# Patient Record
Sex: Female | Born: 1980 | Race: White | Hispanic: No | Marital: Married | Smoking: Never smoker
Health system: Southern US, Community
[De-identification: ages and names within clinical notes are randomized; demographics above are authoritative.]

## PROBLEM LIST (undated history)

## (undated) DIAGNOSIS — F419 Anxiety disorder, unspecified: Secondary | ICD-10-CM

## (undated) DIAGNOSIS — E282 Polycystic ovarian syndrome: Secondary | ICD-10-CM

## (undated) HISTORY — DX: Anxiety disorder, unspecified: F41.9

## (undated) HISTORY — PX: BARIATRIC SURGERY: SHX1103

---

## 2011-12-30 ENCOUNTER — Ambulatory Visit (INDEPENDENT_AMBULATORY_CARE_PROVIDER_SITE_OTHER): Payer: BC Managed Care – PPO | Admitting: Internal Medicine

## 2011-12-30 VITALS — BP 110/75 | HR 82 | Temp 98.0°F | Resp 16 | Ht 66.0 in | Wt 276.6 lb

## 2011-12-30 DIAGNOSIS — N970 Female infertility associated with anovulation: Secondary | ICD-10-CM | POA: Insufficient documentation

## 2011-12-30 DIAGNOSIS — N390 Urinary tract infection, site not specified: Secondary | ICD-10-CM

## 2011-12-30 DIAGNOSIS — N949 Unspecified condition associated with female genital organs and menstrual cycle: Secondary | ICD-10-CM

## 2011-12-30 DIAGNOSIS — R102 Pelvic and perineal pain: Secondary | ICD-10-CM

## 2011-12-30 DIAGNOSIS — Z6841 Body Mass Index (BMI) 40.0 and over, adult: Secondary | ICD-10-CM | POA: Insufficient documentation

## 2011-12-30 LAB — POCT URINALYSIS DIPSTICK
Glucose, UA: NEGATIVE
Leukocytes, UA: NEGATIVE
Nitrite, UA: NEGATIVE
Urobilinogen, UA: 0.2

## 2011-12-30 LAB — POCT UA - MICROSCOPIC ONLY
Casts, Ur, LPF, POC: NEGATIVE
Yeast, UA: NEGATIVE

## 2011-12-30 MED ORDER — CIPROFLOXACIN HCL 500 MG PO TABS: 500.0000 mg | ORAL_TABLET | Freq: Two times a day (BID) | ORAL | Status: AC

## 2011-12-30 MED ORDER — TRAMADOL HCL 50 MG PO TABS: ORAL_TABLET | ORAL | Status: AC

## 2011-12-30 NOTE — Progress Notes (Signed)
  Subjective:    Patient ID: Abigail Young, female    DOB: 1980-09-25, 31 y.o.   MRN: [id number]  HPIClient complaining of suprapubic pain for greater than one month Was at [facility] at the onset of this with the diagnosis of norovirus/UTI was also suspected and she was treated with 3 weeks of Keflex/unsure whether culture was done She had severe GI side effects of Keflex and this exacerbated her usual diarrhea with irritable bowel syndrome Over the past week she has noticed urgency with increased frequency and small void but no dysuria Her pain is in the suprapubic area and is fairly constant and worse with activity/is different from her usual menstrual cramps She started her period today Married with one partner/has noted dyspareunia over the past month/no history of ovarian cysts/last ultrasound in January   Family history-her sister's Theressa Millard  Review of SystemsCurrent evaluation with gynecology  And endocrine infertility in an attempt to get pregnant/first cycle of Clomid in January/second to start next week No prior UTI or stone Denies nausea vomiting or constipation/diarrhea is frequent with IBS     Objective:   Physical ExamStable in no acute distress with a BMI of 44 Abdomen is soft/no organomegaly/tender in the suprapubic area without clear mass/ Introitus is clear without lesions There is no tenderness to percussion and no rebound There is no flank tenderness to percussion  Procedure: The cause of the sense of urgency and incomplete void/hand because she gave such a small urinary sample/and out catheterization was performed in attempt to obtain a clean-catch urine The original dirty urine had too numerous to count red blood cells/Urine volume was only 30 cc    Results for orders placed in visit on 12/30/11  POCT UA - MICROSCOPIC ONLY      Component Value Range   WBC, Ur, HPF, POC 0-2     RBC, urine, microscopic tntc     Bacteria, U  Microscopic trace     Mucus, UA 3+     Epithelial cells, urine per micros 3-6     Crystals, Ur, HPF, POC 1+ Ca Oxal     Casts, Ur, LPF, POC neg     Yeast, UA neg    POCT URINALYSIS DIPSTICK      Component Value Range   Color, UA yellow     Clarity, UA cloudy     Glucose, UA neg     Bilirubin, UA small     Ketones, UA trace     Spec Grav, UA >=1.030     Blood, UA large     pH, UA 5.0     Protein, UA 100     Urobilinogen, UA 0.2     Nitrite, UA neg     Leukocytes, UA Negative      Urinalysis on specimen obtained by catheterization was negative except specific gravity 10:30 The specimen was sent for culture Assessment & Plan:  Problem #1 pelvic pain Problem #2 urinary urgency frequency and small void Problem #3 BMI 44 Problem #4 irritable bowel syndrome  Urine culture Start Cipro 500 twice a day #20 Tramadol 5200 mg if needed for pain Set up transvaginal ultrasound if urine culture is negative-This may be best done with her gynecologist in w-s= Dr. O'Neill/ursa oneal?

## 2011-12-30 NOTE — Progress Notes (Signed)
  Subjective:    Patient ID: Abigail Young, female    DOB: [birth year], 31 y.o.   MRN: [id number]  HPI    Review of Systems     Objective:   Physical Exam I&O cath performed per protocol. ~20cc urine obtained.  Pt tolerated well      Performed by [facility] Assessment & Plan:

## 2012-01-01 LAB — URINE CULTURE: Organism ID, Bacteria: NO GROWTH

## 2012-05-27 ENCOUNTER — Encounter: Payer: BC Managed Care – PPO | Admitting: Obstetrics and Gynecology

## 2015-09-21 ENCOUNTER — Ambulatory Visit (INDEPENDENT_AMBULATORY_CARE_PROVIDER_SITE_OTHER): Payer: Managed Care, Other (non HMO) | Admitting: Physician Assistant

## 2015-09-21 VITALS — BP 110/84 | HR 78 | Temp 97.6°F | Resp 16 | Wt 295.6 lb

## 2015-09-21 DIAGNOSIS — Z23 Encounter for immunization: Secondary | ICD-10-CM | POA: Diagnosis not present

## 2015-09-21 DIAGNOSIS — Z3202 Encounter for pregnancy test, result negative: Secondary | ICD-10-CM

## 2015-09-21 LAB — POCT URINE PREGNANCY: PREG TEST UR: NEGATIVE

## 2015-09-21 NOTE — Patient Instructions (Addendum)
Return as needed.  MMR Vaccine (Measles, Mumps and Rubella): What You Need to Know 1. Why get vaccinated? Measles, mumps, and rubella are serious diseases. Before vaccines they were very common, especially among children. Measles  Measles virus causes rash, cough, runny nose, eye irritation, and fever.  It can lead to ear infection, pneumonia, seizures (jerking and staring), brain damage, and death. Mumps  Mumps virus causes fever, headache, muscle pain, loss of appetite, and swollen glands.  It can lead to deafness, meningitis (infection of the brain and spinal cord covering), painful swelling of the testicles or ovaries, and rarely sterility. Rubella ([place] Measles)  Rubella virus causes rash, arthritis (mostly in women), and mild fever.  If a woman gets rubella while she is pregnant, she could have a miscarriage or her baby could be born with serious birth defects. These diseases spread from person to person through the air. You can easily catch them by being around someone who is already infected. Measles, mumps, and rubella (MMR) vaccine can protect children (and adults) from all three of these diseases. Thanks to successful vaccination programs these diseases are much less common in the [place] than they used to be. But if we stopped vaccinating they would return.  2. Who should get MMR vaccine and when? Children should get 2 doses of MMR vaccine:  First Dose: 34-64 months of age  Second Dose: 57-57 years of age (may be given earlier, if at least 28 days after the 1st dose) Some infants younger than 12 months should get a dose of MMR if they are traveling out of the country. (This dose will not count toward their routine series.) Some adults should also get MMR vaccine: Generally, anyone 71 years of age or older who was born after 40 should get at least one dose of MMR vaccine, unless they can show that they have either been vaccinated or had all three diseases. MMR vaccine may be  given at the same time as other vaccines. Children between 68 and 61 years of age can get a "combination" vaccine called MMRV, which contains both MMR and varicella (chickenpox) vaccines. There is a separate Vaccine Information Statement for MMRV. 3. Some people should not get MMR vaccine or should wait.  Anyone who has ever had a life-threatening allergic reaction to the antibiotic neomycin, or any other component of MMR vaccine, should not get the vaccine. Tell your doctor if you have any severe allergies.  Anyone who had a life-threatening allergic reaction to a previous dose of MMR or MMRV vaccine should not get another dose.  Some people who are sick at the time the shot is scheduled may be advised to wait until they recover before getting MMR vaccine.  Pregnant women should not get MMR vaccine. Pregnant women who need the vaccine should wait until after giving birth. Women should avoid getting pregnant for 4 weeks after vaccination with MMR vaccine.  Tell your doctor if the person getting the vaccine:  Has HIV/AIDS, or another disease that affects the immune system  Is being treated with drugs that affect the immune system, such as steroids  Has any kind of cancer  Is being treated for cancer with radiation or drugs  Has ever had a low platelet count (a blood disorder)  Has gotten another vaccine within the past 4 weeks  Has recently had a transfusion or received other blood products Any of these might be a reason to not get the vaccine, or delay vaccination until later. 4.  What are the risks from MMR vaccine? A vaccine, like any medicine, is capable of causing serious problems, such as severe allergic reactions.  The risk of MMR vaccine causing serious harm, or death, is extremely small. Getting MMR vaccine is much safer than getting measles, mumps or rubella. Most people who get MMR vaccine do not have any serious problems with it. Mild problems  Fever (up to 1 person out of  6)  Mild rash (about 1 person out of 20)  Swelling of glands in the cheeks or neck (about 1 person out of 75) If these problems occur, it is usually within 6-14 days after the shot. They occur less often after the second dose. Moderate problems  Seizure (jerking or staring) caused by fever (about 1 out of 3,000 doses)  Temporary pain and stiffness in the joints, mostly in teenage or adult women (up to 1 out of 4)  Temporary low platelet count, which can cause a bleeding disorder (about 1 out of 30,000 doses) Severe problems (very rare)  Serious allergic reaction (less than 1 out of a million doses)  Several other severe problems have been reported after a child gets MMR vaccine, including:  Deafness  Long-term seizures, coma, or lowered consciousness  Permanent brain damage These are so rare that it is hard to tell whether they are caused by the vaccine.  5. What if there is a serious reaction? What should I look for?  Look for anything that concerns you, such as signs of a severe allergic reaction, very high fever, or behavior changes. Signs of a severe allergic reaction can include hives, swelling of the face and throat, difficulty breathing, a fast heartbeat, dizziness, and weakness. These would start a few minutes to a few hours after the vaccination.  What should I do?  If you think it is a severe allergic reaction or other emergency that can't wait, call 9-1-1 or get the person to the nearest hospital. Otherwise, call your doctor.  Afterward, the reaction should be reported to the Vaccine Adverse Event Reporting System (VAERS). Your doctor might file this report, or you can do it yourself through the VAERS web site at www.vaers.SamedayNews.es, or by calling 228-016-7317. VAERS is only for reporting reactions. They do not give medical advice. 6. The National Vaccine Injury Compensation Program The [employer] Vaccine Injury Compensation Program (VICP) is a federal program that was  created to compensate people who may have been injured by certain vaccines. Persons who believe they may have been injured by a vaccine can learn about the program and about filing a claim by calling 616-175-3581 or visiting the [place] website at GoldCloset.com.ee. 7. How can I learn more?  Ask your doctor.  Call your local or state health department.  Contact the Centers for Disease Control and Prevention (CDC):  Call 319-333-6996 (1-800-CDC-INFO)  or  Visit CDC's website at http://hunter.com/ CDC Measles, Mumps, and Rubella (MMR) Interim VIS (01/05/11)   This information is not intended to replace advice given to you by your health care provider. Make sure you discuss any questions you have with your health care provider.   Document Released: 07/01/2006 Document Revised: 09/24/2014 Document Reviewed: 10/15/2013 Elsevier Interactive Patient Education [employer].

## 2015-09-21 NOTE — Progress Notes (Signed)
[facility] [address], [place] [address] [phone]  Date:  09/21/2015   Name:  Abigail Young   DOB:  04/08/1981   MRN:  [id number]  PCP:  No primary care provider on file.    Chief Complaint: Immunizations   History of Present Illness:  This is a 35 y.o. female with PMH obesity who is presenting wanting a third MMR vaccine. Pt is a Insurance underwriter at [employer]. She states there was an outbreak of mumps on campus with 300+ cases. The school has recommended that all students get a booster even if they have already had 2 vaccines. She states pregnancy is very unlikely since she has PCOS but she cannot be 100% sure.  Review of Systems:  Review of Systems See HPI  Patient Active Problem List   Diagnosis Date Noted  . Body mass index (BMI) of 40.0-44.9 in adult ([place]) 12/30/2011  . Infertility associated with anovulation 12/30/2011    Prior to Admission medications   Medication Sig Start Date End Date Taking? Authorizing Provider  traMADol (ULTRAM) 50 MG tablet One to two q 6 h prn Patient not taking: Reported on 09/21/2015 12/30/11   Leandrew Koyanagi, MD    Allergies  Allergen Reactions  . Cephalexin Diarrhea and Nausea And Vomiting  . Glucophage [Metformin Hydrochloride] Diarrhea and Nausea Only    History reviewed. No pertinent past surgical history.  Social History  Substance Use Topics  . Smoking status: Never Smoker   . Smokeless tobacco: None  . Alcohol Use: None    History reviewed. No pertinent family history.  Medication list has been reviewed and updated.  Physical Examination:  Physical Exam  Constitutional: She is oriented to person, place, and time. She appears well-developed and well-nourished. No distress.  HENT:  Head: Normocephalic and atraumatic.  Right Ear: Hearing normal.  Left Ear: Hearing normal.  Nose: Nose normal.  Eyes: Conjunctivae and lids are normal. Right eye exhibits no discharge. Left eye exhibits no discharge.  No scleral icterus.  Pulmonary/Chest: Effort normal. No respiratory distress.  Musculoskeletal: Normal range of motion.  Neurological: She is alert and oriented to person, place, and time.  Skin: Skin is warm, dry and intact. No lesion and no rash noted.  Psychiatric: She has a normal mood and affect. Her speech is normal and behavior is normal. Thought content normal.   BP 110/84 mmHg  Pulse 78  Temp(Src) 97.6 F (36.4 C) (Oral)  Resp 16  Wt 295 lb 9.6 oz (134.083 kg)  SpO2 97%  Results for orders placed or performed in visit on 09/21/15  POCT urine pregnancy  Result Value Ref Range   Preg Test, Ur Negative Negative    Assessment and Plan:  1. Need for MMR vaccine - POCT urine pregnancy - MMR vaccine subcutaneous   Benjaman Pott. Drenda Freeze, MHS [facility]  09/21/2015

## 2017-09-12 ENCOUNTER — Encounter (HOSPITAL_COMMUNITY): Payer: Self-pay

## 2017-09-12 ENCOUNTER — Emergency Department (HOSPITAL_COMMUNITY)
Admission: EM | Admit: 2017-09-12 | Discharge: 2017-09-12 | Disposition: A | Discharge status: Home / Self Care | Payer: 59 | Attending: Emergency Medicine | Admitting: Emergency Medicine

## 2017-09-12 ENCOUNTER — Emergency Department (HOSPITAL_COMMUNITY): Payer: 59

## 2017-09-12 DIAGNOSIS — K8020 Calculus of gallbladder without cholecystitis without obstruction: Secondary | ICD-10-CM | POA: Insufficient documentation

## 2017-09-12 DIAGNOSIS — R109 Unspecified abdominal pain: Secondary | ICD-10-CM | POA: Diagnosis present

## 2017-09-12 HISTORY — DX: Polycystic ovarian syndrome: E28.2

## 2017-09-12 LAB — COMPREHENSIVE METABOLIC PANEL
ALT: 41 U/L (ref 14–54)
AST: 81 U/L — AB (ref 15–41)
Albumin: 4 g/dL (ref 3.5–5.0)
Alkaline Phosphatase: 141 U/L — ABNORMAL HIGH (ref 38–126)
Anion gap: 11 (ref 5–15)
BILIRUBIN TOTAL: 1.2 mg/dL (ref 0.3–1.2)
BUN: 14 mg/dL (ref 6–20)
CO2: 23 mmol/L (ref 22–32)
Calcium: 9 mg/dL (ref 8.9–10.3)
Chloride: 102 mmol/L (ref 101–111)
Creatinine, Ser: 0.79 mg/dL (ref 0.44–1.00)
GFR calc Af Amer: >60 mL/min (ref >60)
Glucose, Bld: 95 mg/dL (ref 65–99)
POTASSIUM: 3.8 mmol/L (ref 3.5–5.1)
Sodium: 136 mmol/L (ref 135–145)
TOTAL PROTEIN: 6.6 g/dL (ref 6.5–8.1)

## 2017-09-12 LAB — URINALYSIS, ROUTINE W REFLEX MICROSCOPIC
Bilirubin Urine: NEGATIVE
Glucose, UA: NEGATIVE mg/dL
Hgb urine dipstick: NEGATIVE
KETONES UR: 5 mg/dL — AB
LEUKOCYTES UA: NEGATIVE
NITRITE: NEGATIVE
PH: 5 (ref 5.0–8.0)
PROTEIN: NEGATIVE mg/dL
Specific Gravity, Urine: 1.018 (ref 1.005–1.030)

## 2017-09-12 LAB — I-STAT BETA HCG BLOOD, ED (MC, WL, AP ONLY): I-stat hCG, quantitative: <5 m[IU]/mL (ref <5)

## 2017-09-12 LAB — CBC
HEMATOCRIT: 41.3 % (ref 36.0–46.0)
Hemoglobin: 14 g/dL (ref 12.0–15.0)
MCH: 29.9 pg (ref 26.0–34.0)
MCHC: 33.9 g/dL (ref 30.0–36.0)
MCV: 88.2 fL (ref 78.0–100.0)
PLATELETS: 279 10*3/uL (ref 150–400)
RBC: 4.68 MIL/uL (ref 3.87–5.11)
RDW: 13 % (ref 11.5–15.5)
WBC: 12.5 10*3/uL — AB (ref 4.0–10.5)

## 2017-09-12 LAB — LIPASE, BLOOD: Lipase: 33 U/L (ref 11–51)

## 2017-09-12 LAB — TROPONIN I

## 2017-09-12 MED ORDER — ONDANSETRON HCL 4 MG PO TABS: 4.0000 mg | ORAL_TABLET | Freq: Three times a day (TID) | ORAL | 0 refills | Status: AC | PRN

## 2017-09-12 MED ORDER — IOPAMIDOL (ISOVUE-300) INJECTION 61%
INTRAVENOUS | Status: AC
  Administered 2017-09-12: 100 mL
  Filled 2017-09-12: qty 100

## 2017-09-12 MED ORDER — HYDROCODONE-ACETAMINOPHEN 5-325 MG PO TABS: 1.0000 | ORAL_TABLET | Freq: Four times a day (QID) | ORAL | 0 refills | Status: AC | PRN

## 2017-09-12 NOTE — ED Provider Notes (Signed)
[facility] EMERGENCY DEPARTMENT Provider Note   CSN: [id number] Arrival date & time: 09/12/17  1511     History   Chief Complaint Chief Complaint  Patient presents with  . Abdominal Pain    HPI Abigail Young is a 36 y.o. female.  HPI   36 year old obese female with history of anxiety and PCOS presenting for evaluation of abdominal pain.  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  She is here due to upper abdominal pain.  She report having acute onset of pain approximately 4 hours ago.  Pain is described as a sharp sensation to the upper abdomen radiates to her right shoulder.  Pain initially very intense, which is since subsided.  Worsening pain when she drinks some fluid earlier.  She felt nauseous, and did vomit several times of nonbloody nonbilious contents.  Since then her pain has improved.  She has a bout of loose stools earlier today.  She is able to pass flatus.  She denies fever, chills, chest pain, shortness of breath, back pain, dysuria, hematuria or rash.  She report one similar episode like this in the past and was diagnosed with gastroenteritis.  Past Medical History:  Diagnosis Date  . Anxiety   . PCOS (polycystic ovarian syndrome)     Patient Active Problem List   Diagnosis Date Noted  . Body mass index (BMI) of 40.0-44.9 in adult ([place]) 12/30/2011  . Infertility associated with anovulation 12/30/2011    Past Surgical History:  Procedure Laterality Date  . BARIATRIC SURGERY      OB History    No data available       Home Medications    Prior to Admission medications   Medication Sig Start Date End Date Taking? Authorizing Provider  traMADol Veatrice Bourbon) 50 MG tablet One to two q 6 h prn Patient not taking: Reported on 09/21/2015 12/30/11   Leandrew Koyanagi, MD    Family History History reviewed. No pertinent family history.  Social History Social History   Tobacco Use  . Smoking status: Never Smoker  .  Smokeless tobacco: Never Used  Substance Use Topics  . Alcohol use: Not on file  . Drug use: Not on file     Allergies   Cephalexin and Glucophage [metformin hydrochloride]   Review of Systems Review of Systems  All other systems reviewed and are negative.    Physical Exam Updated Vital Signs BP 111/63   Pulse 60   Temp 98.9 F (37.2 C)   Resp 18   Ht 5' 7"  (1.702 m)   Wt 66.7 kg (147 lb)   LMP 08/19/2017 (Exact Date)   SpO2 99%   BMI 23.02 kg/m   Physical Exam  Constitutional: She appears well-developed and well-nourished. No distress.  HENT:  Head: Atraumatic.  Eyes: Conjunctivae are normal.  Neck: Neck supple.  Cardiovascular: Normal rate and regular rhythm.  Pulmonary/Chest: Effort normal and breath sounds normal.  Abdominal: Soft. Normal appearance. She exhibits no distension and no mass. Bowel sounds are decreased. There is tenderness (Epigastric tenderness without guarding or rebound tenderness).  Neurological: She is alert.  Skin: No rash noted.  Psychiatric: She has a normal mood and affect.  Nursing note and vitals reviewed.    ED Treatments / Results  Labs (all labs ordered are listed, but only abnormal results are displayed) Labs Reviewed  COMPREHENSIVE METABOLIC PANEL - Abnormal; Notable for the following components:      Result Value  AST 81 (*)    Alkaline Phosphatase 141 (*)    All other components within normal limits  CBC - Abnormal; Notable for the following components:   WBC 12.5 (*)    All other components within normal limits  URINALYSIS, ROUTINE W REFLEX MICROSCOPIC - Abnormal; Notable for the following components:   Ketones, ur 5 (*)    All other components within normal limits  LIPASE, BLOOD  TROPONIN I  I-STAT BETA HCG BLOOD, ED ([facility], [facility], [facility] ONLY)    EKG  EKG Interpretation None     ED ECG REPORT   Date: 09/12/2017  Rate: 75  Rhythm: normal sinus rhythm  QRS Axis: normal  Intervals: normal  ST/T Wave  abnormalities: normal  Conduction Disutrbances:none  Narrative Interpretation: biatrial enlargement  Old EKG Reviewed: none available  I have personally reviewed the EKG tracing and agree with the computerized printout as noted.   Radiology Ct Abdomen Pelvis W Contrast  Result Date: 09/12/2017 CLINICAL DATA:  Abdominal pain EXAM: CT ABDOMEN AND PELVIS WITH CONTRAST TECHNIQUE: Multidetector CT imaging of the abdomen and pelvis was performed using the standard protocol following bolus administration of intravenous contrast. CONTRAST:  160m ISOVUE-300 IOPAMIDOL (ISOVUE-300) INJECTION 61% COMPARISON:  None. FINDINGS: Lower chest: Lung bases are clear. Hepatobiliary: No focal liver lesions are appreciable. Gallbladder wall does not appear appreciably thickened. There is apparent small amount of fluid in the gallbladder fossa region. No biliary duct dilatation evident. Pancreas: There is no appreciable pancreatic mass or inflammatory focus. Spleen: No splenic lesions are evident. Adrenals/Urinary Tract: Adrenals bilaterally appear normal. There is a 1 x 1 cm cyst arising from the posterior upper to mid left kidney. More superiorly, there is an 8 x 5 mm cyst. No hydronephrosis is evident in either kidney. There is no renal or ureteral calculus on either side. Urinary bladder is midline with wall thickness within normal limits. Stomach/Bowel: There is evidence of previous gastric bypass type procedure without wall thickening or fluid in this area. Elsewhere there is no bowel wall or mesenteric thickening. No evident bowel obstruction. No free air or portal venous air. Vascular/Lymphatic: There is no abdominal aortic aneurysm. No vascular lesions are evident. There is no evident adenopathy in the abdomen or pelvis. Reproductive: Uterus is anteverted. There is a collapsed cyst in the right ovary measuring 1.4 x 1.0 cm. There is moderate fluid tracking from the right adnexa into the cul-de-sac region consistent  with ovarian cyst rupture. No other pelvic mass is evident. Other: Appendix appears unremarkable. There is no abscess in the abdomen or pelvis. Musculoskeletal: No blastic or lytic bone lesions. No intramuscular or abdominal wall lesions. IMPRESSION: 1. Evidence of recent right ovarian cyst rupture with fluid tracking the cul de sac region. 2. The small amount of fluid is noted in the gallbladder fossa region. The adjacent gallbladder wall does not appear dilated. Question ascitic fluid from the pelvis tracking into this area versus possible fluid due to gallbladder inflammation. This finding may warrant ultrasound of the gallbladder to further assess in this regard. 3. 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 No bowel obstruction. No internal hernia demonstrated on this study. No abscess evident in the abdomen or pelvis. Appendix appears normal. 4.  No renal or ureteral calculus.  No hydronephrosis. Electronically Signed   By: WLowella GripIII M.D.   On: 09/12/2017 20:37    Procedures Procedures (including critical care time)  Medications Ordered in ED Medications  iopamidol (ISOVUE-300) 61 %  injection (100 mLs  Contrast Given 09/12/17 2012)     Initial Impression / Assessment and Plan / ED Course  I have reviewed the triage vital signs and the nursing notes.  Pertinent labs & imaging results that were available during my care of the patient were reviewed by me and considered in my medical decision making (see chart for details).     BP 111/63   Pulse 60   Temp 98.9 F (37.2 C)   Resp 18   Ht 5' 7"  (1.702 m)   Wt 66.7 kg (147 lb)   LMP 08/19/2017 (Exact Date)   SpO2 99%   BMI 23.02 kg/m    Final Clinical Impressions(s) / ED Diagnoses   Final diagnoses:  None    ED Discharge Orders    None     7:00 PM Patient with history of Roux-en-Y here with upper abdominal pain, nausea and vomiting.  Symptom has since subsided however having some  pain and she would evaluate for potential small bowel obstruction versus perforated bowel.  CT scan ordered.  Pain medication offered but patient declined.  She is stable at this time.  8:53 PM Labs remarkable for mildly elevated white count of 12.5.  Electrolytes panels are reassuring.  Mild elevations of AST of 81, alk phos of 141.  Pregnancy test negative, lipase normal, EKG and troponins unremarkable, renal function is fine, and urine shows no signs of urinary tract infection.  An abdominal pelvis CT scan performed showing evidence of a recent right ovarian cyst rupture with fluid tracking the cul-de-sac region.  Also a small amount of fluid is noted in the gallbladder fossa region.  This could be fluid that is tracking from the pelvis or could be possible gallbladder inflammation.  However the gallbladder wall does not appears to be dilated.  No document of gallstone.  Evidence of prior gastric bypass procedure without any complication feature.  No bowel obstruction and no hernia or abscess.  Appendix is normal, renal function is normal and kidney function is normal.  I discussed this finding with patient.  I also offer ultrasound of the gallbladder for further evaluation. Pt agrees with ultrasound.  Care sign out with oncoming provider who will f/u with [place] result.  Pt currently does not request pain medication or antinausea medication.    Domenic Moras, PA-C 09/13/17 1102    Duffy Bruce, MD 09/13/17 1520

## 2017-09-12 NOTE — ED Triage Notes (Signed)
Pt presents with onset of epigastric pain that began today.  Pt describes as a burning and aching that is now generalized.  Pt reports pain radiates into R shoulder, pt attempted to sip water but burning sensation increased.  -nausea, pt had diarrhea this morning, denies any dysuria.

## 2017-09-12 NOTE — ED Notes (Signed)
Patient transported to Ultrasound 

## 2017-09-12 NOTE — ED Notes (Signed)
Lab adding on a troponin

## 2017-09-12 NOTE — ED Provider Notes (Signed)
Patient signed out to me by Abigail BarnacleP Tran, PA-C.  Please see previous notes for further history.  In brief, patient with a history of Roux-en-Y.  She presents with acute epigastric pain today with associated nausea and vomiting.  She has right shoulder pain.  White count mildly elevated at 12.  CT showed recent rupture of right ovarian cyst with some pericholecystic fluid, recommended ultrasound for follow-up.  P.o. challenge and fluids did not improve symptoms, ultrasound pending.  US shows gallstones without signs of gallbladder wall thickening or cholecystitis.  Discussed findings with patient.  Patient states she is feeling much better.  Will treat symptomatically with Zofran and Norco as needed.  Patient to follow-up with surgery for further evaluation and management.  At this time, patient appears safe for discharge.  Return precautions given.  Patient states she understands and agrees to plan.    Abigail Young, Abigail Gannett, PA-C 09/13/17 0036    Jacalyn LefevreHaviland, Julie, MD 09/13/17 1511

## 2017-09-12 NOTE — Discharge Instructions (Signed)
Be careful with what you are eating.  Try to avoid fatty foods. Use Zofran as needed for nausea or vomiting. Take Tylenol as needed for mild to moderate pain.  Take Norco as needed for severe pain.  Do not take the 2 together, as this will be too much Tylenol. Follow-up with surgery for further evaluation of gallstones or if your symptoms are not improving. Return to the emergency room if your pain worsens, you have persistent vomiting despite medication, you develop fevers, or you have any new or concerning symptoms.

## 2017-09-19 MED FILL — HYDROCODON-APAP 5-325: 5-325 | 1 days supply | Qty: 6 | Fill #0

## 2017-09-19 MED FILL — ONDANSETRON HCL 4 MG TABLET: 4 | 4 days supply | Qty: 12 | Fill #0

## 2019-03-24 IMAGING — US US ABDOMEN LIMITED
1 series · 14 of 25 positions shown · non-contrast
Comparison: CT abdomen/pelvis dated 09/12/2017

CLINICAL DATA: Abdominal pain, abnormal gallbladder on CT

EXAM:
ULTRASOUND ABDOMEN LIMITED RIGHT UPPER QUADRANT

[Series 1: us abdomen limited · 0.22mm/px · 14 of 70 slices shown]
[im 1/70]
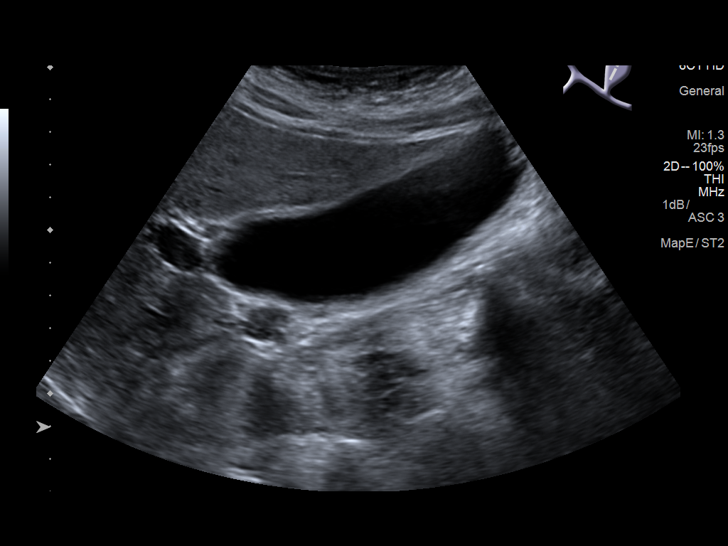
[im 6/70]
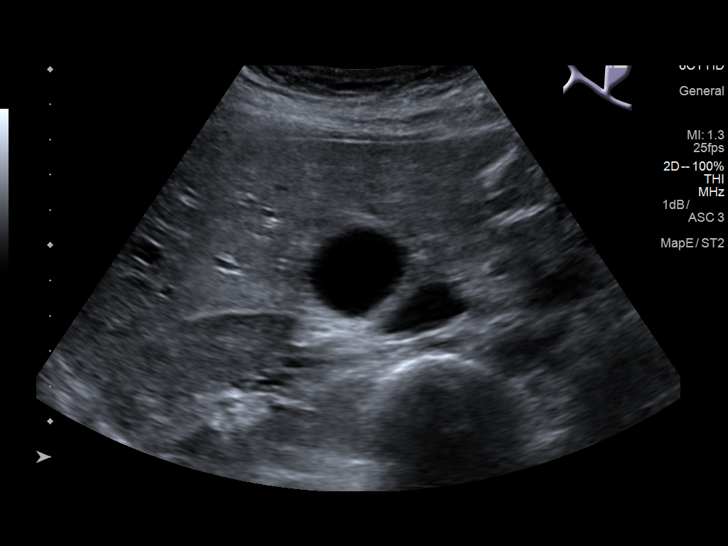
[im 12/70]
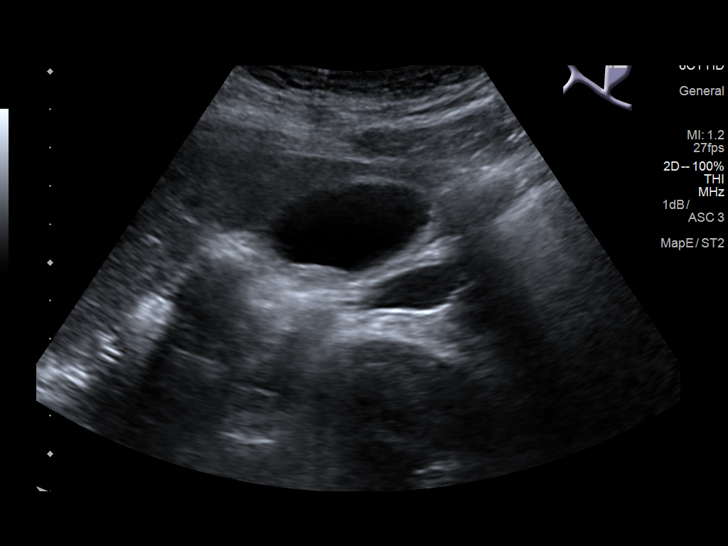
[im 18/70]
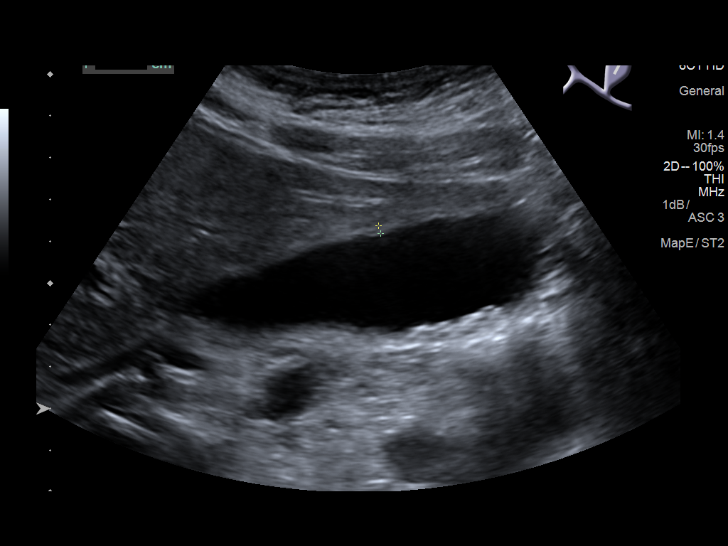
[im 24/70]
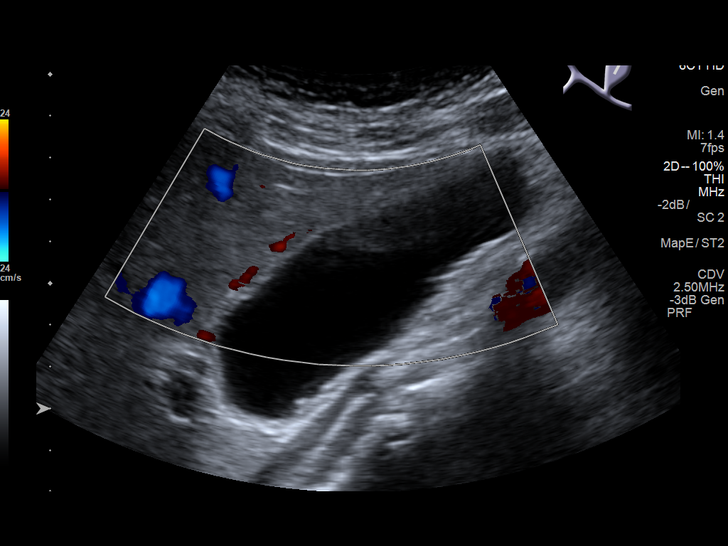
[im 26/70]
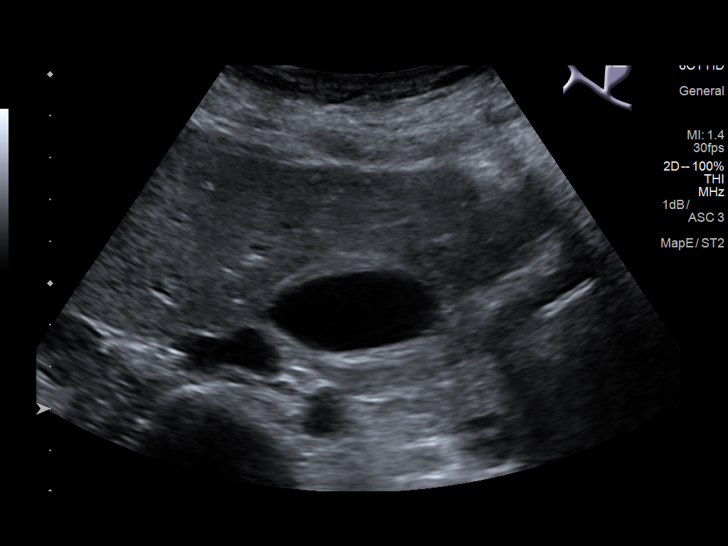
[im 32/70]
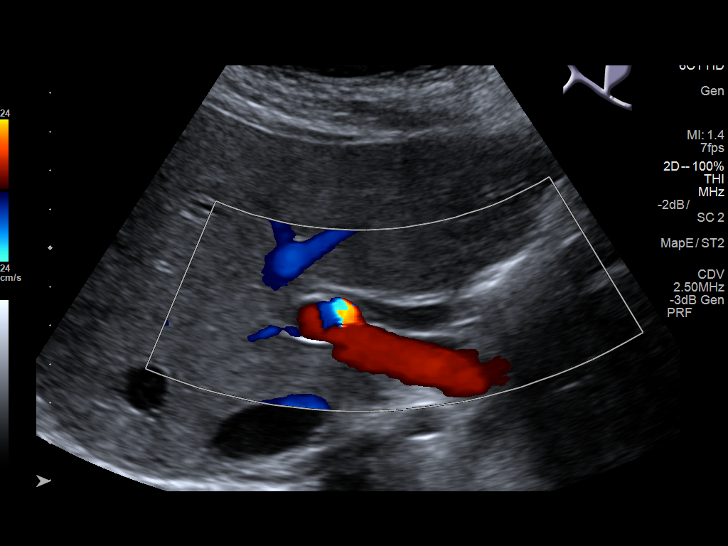
[im 38/70]
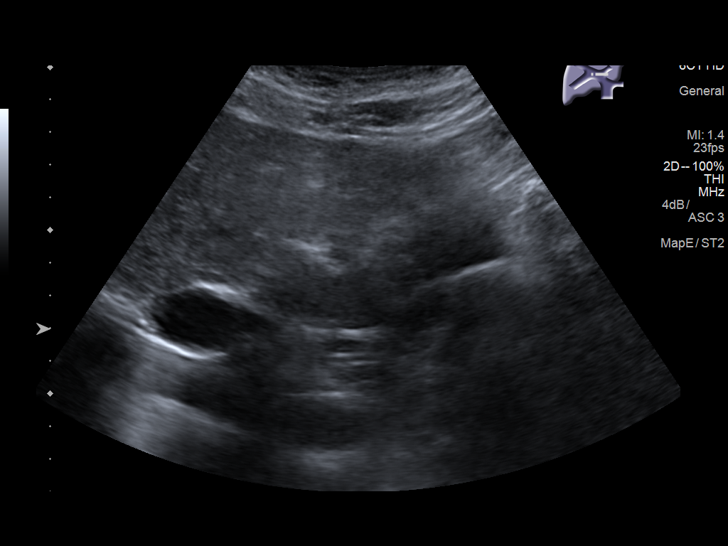
[im 44/70]
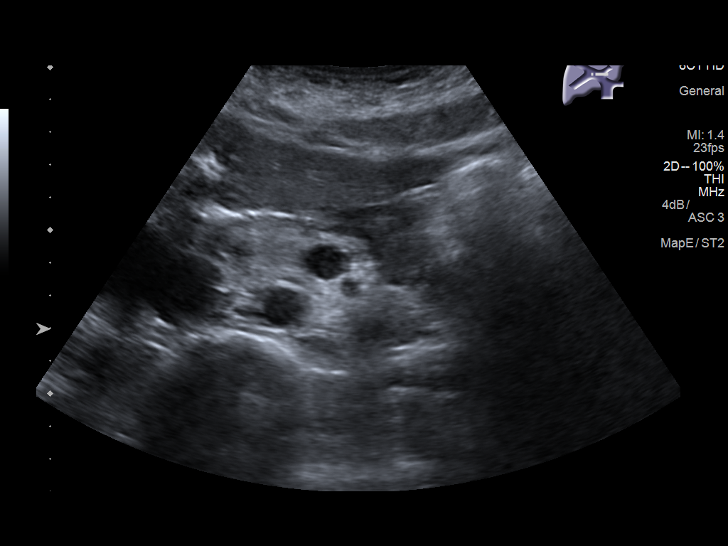
[im 47/70]
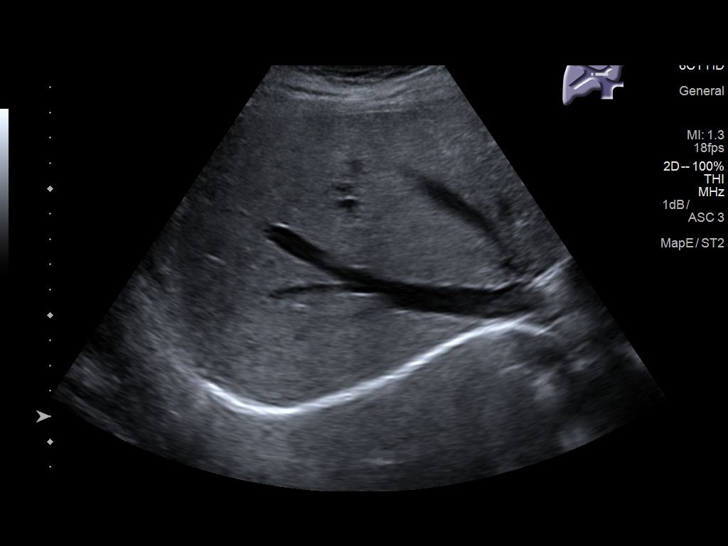
[im 52/70]
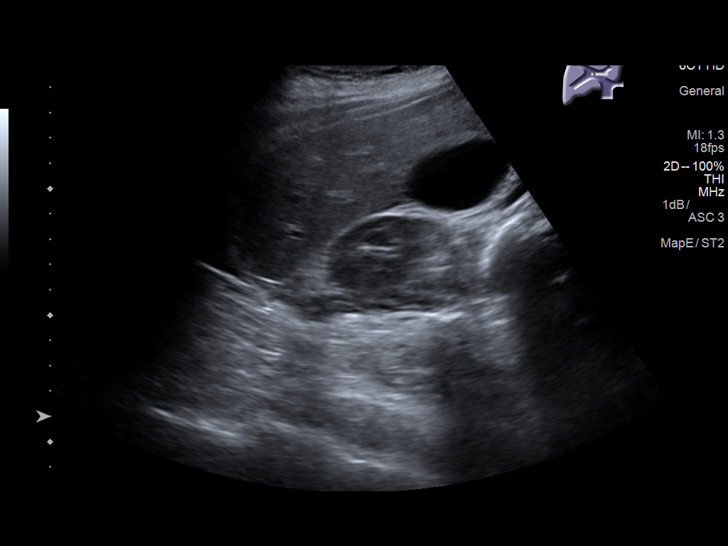
[im 58/70]
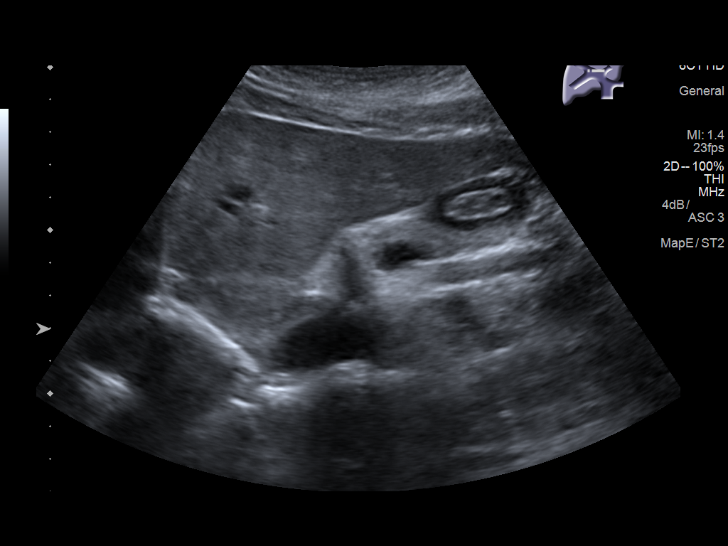
[im 64/70]
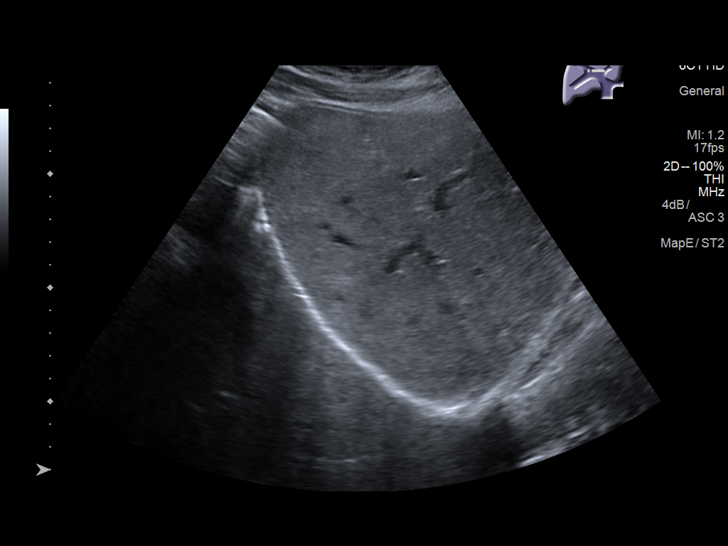
[im 70/70]
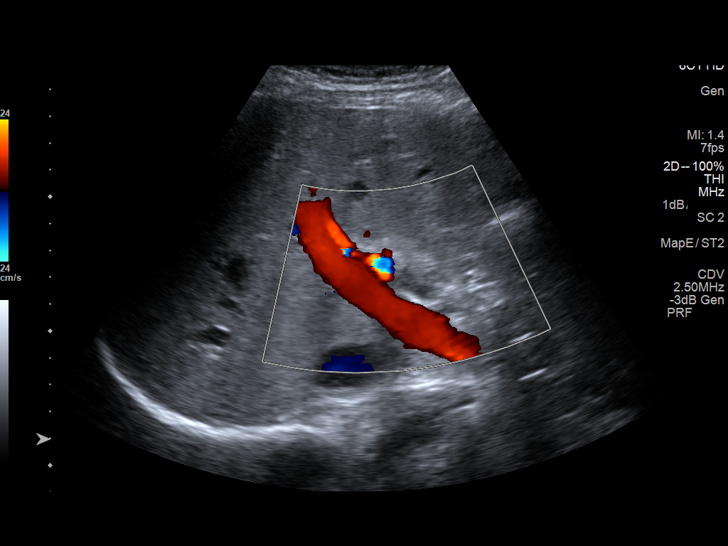

[14 of 25 positions shown; findings below may reference images not displayed]

FINDINGS: Gallbladder:

Layering gallstones measuring up to 15 mm. Trace pericholecystic
fluid without appreciable gallbladder wall thickening. Negative
sonographic Murphy's sign.

Common bile duct:

Diameter: 7 mm

Liver:

No focal lesion identified. Within normal limits in parenchymal
echogenicity. Portal vein is patent on color Doppler imaging with
normal direction of blood flow towards the liver.
IMPRESSION: Cholelithiasis, without convincing sonographic findings to suggest
acute cholecystitis.

## 2019-03-30 IMAGING — CT CT ABD-PELV W/ CM
2 of 4 series · 16 of 46 positions shown, 18 images · IV contrast (APPLIED)
Comparison: None.

CLINICAL DATA: Abdominal pain

EXAM:
CT ABDOMEN AND PELVIS WITH CONTRAST
TECHNIQUE: Multidetector CT imaging of the abdomen and pelvis was performed
using the standard protocol following bolus administration of
intravenous contrast.
CONTRAST:  100mL LA4JAK-MMM IOPAMIDOL (LA4JAK-MMM) INJECTION 61%

[Series 3: abd/ pelvis 5.0 i30f 2 · axial · 0.86mm/px · z∈[-572,-132]mm · 13 of 98 slices shown, 15 images]
[im 5/98  soft-tissue]
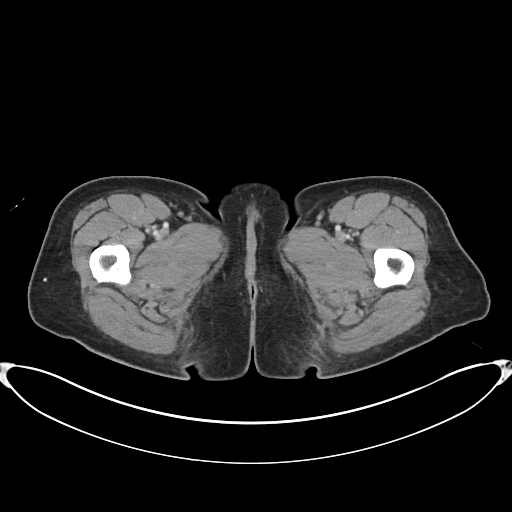
[im 5/98  bone]
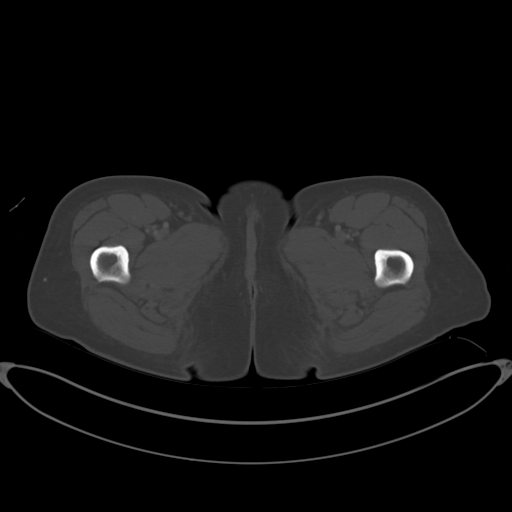
[im 13/98  soft-tissue]
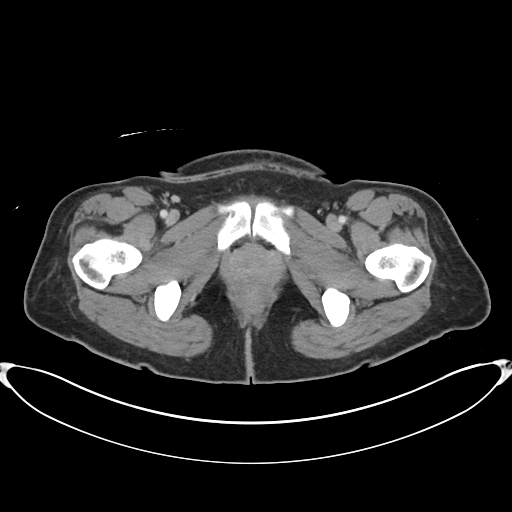
[im 21/98  soft-tissue]
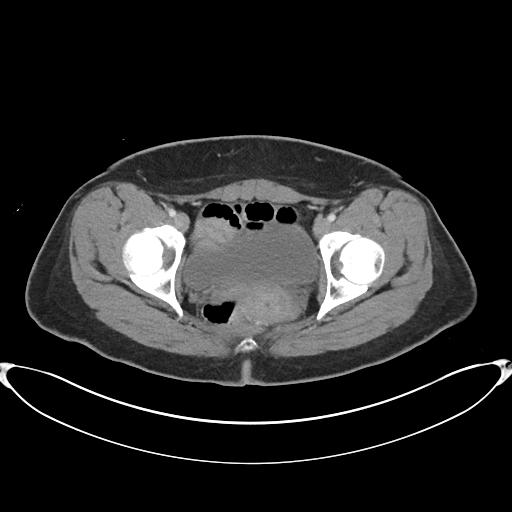
[im 29/98  soft-tissue]
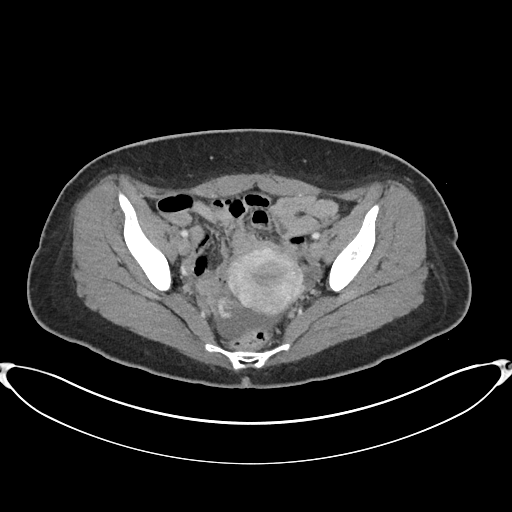
[im 33/98  soft-tissue]
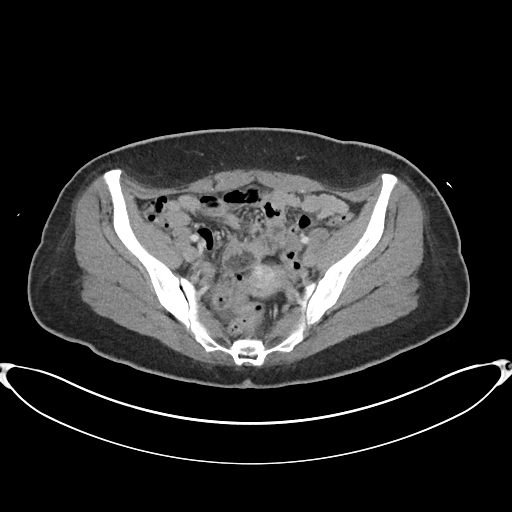
[im 41/98  soft-tissue]
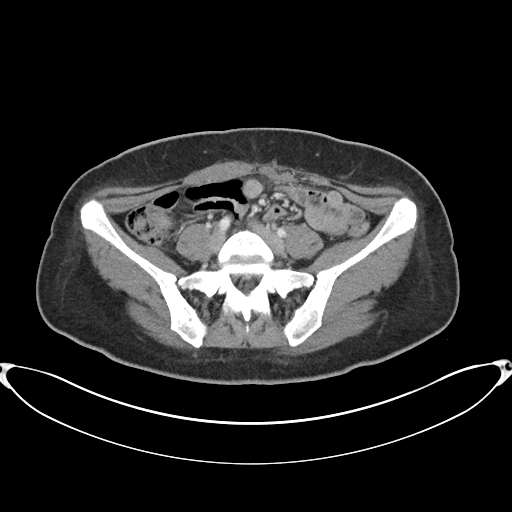
[im 49/98  soft-tissue]
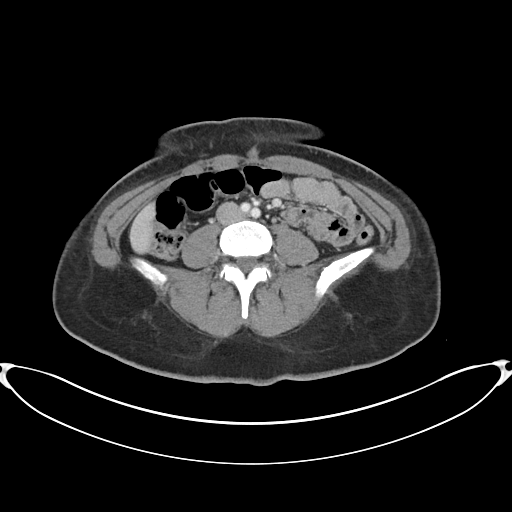
[im 57/98  soft-tissue]
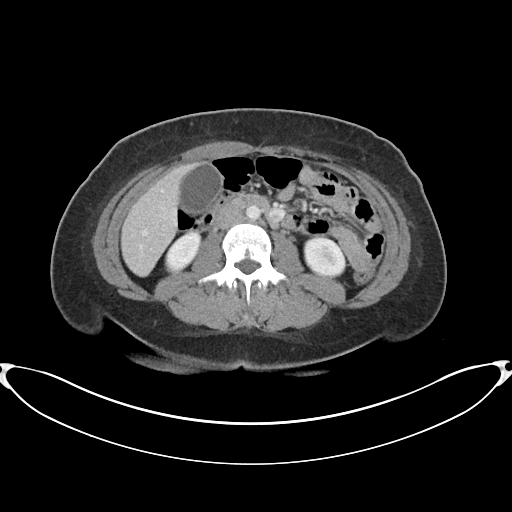
[im 65/98  soft-tissue]
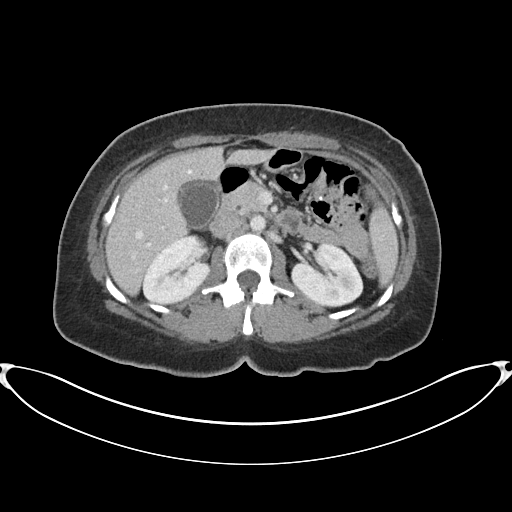
[im 65/98  bone]
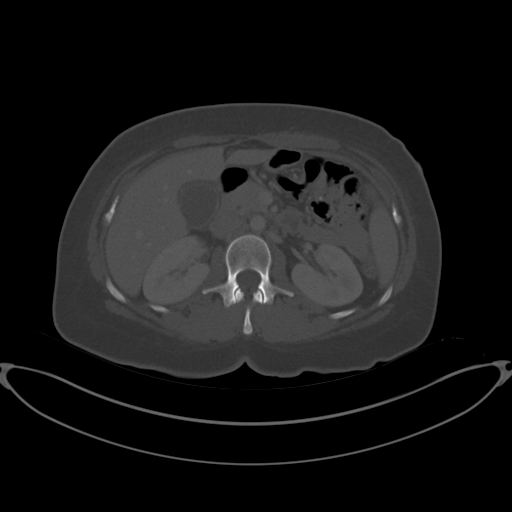
[im 69/98  soft-tissue]
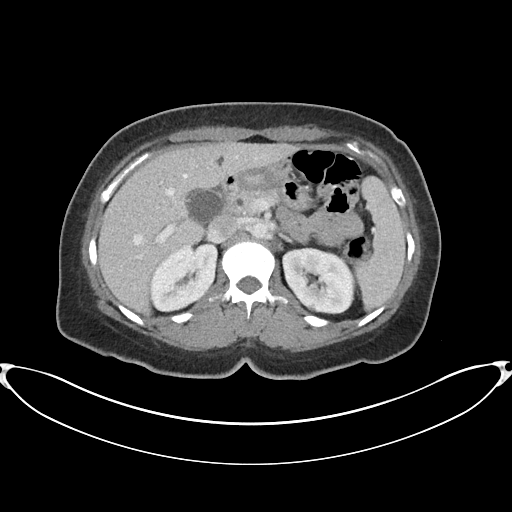
[im 77/98  soft-tissue]
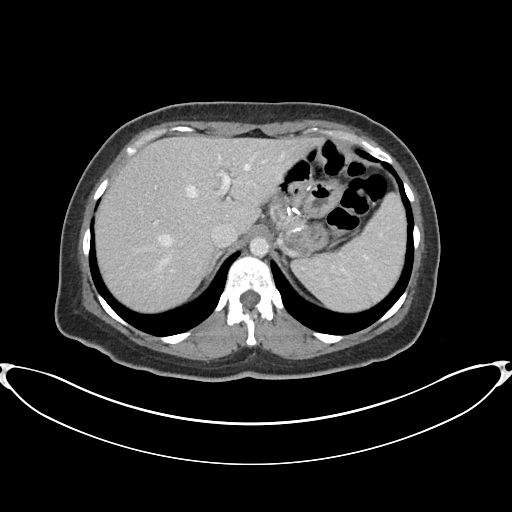
[im 85/98  soft-tissue]
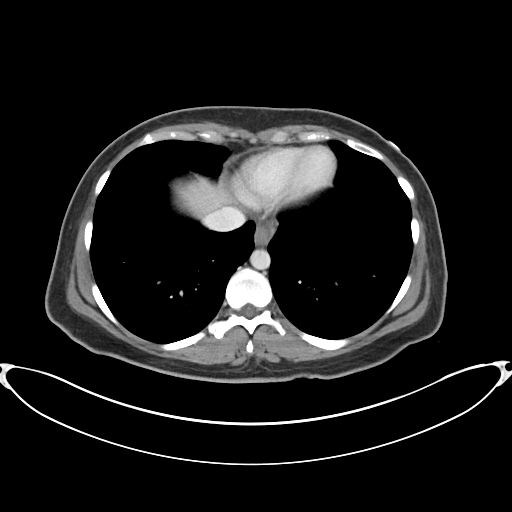
[im 93/98  soft-tissue]
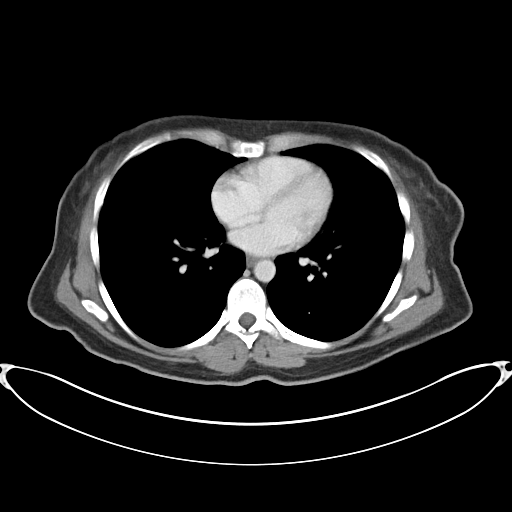

[Series 6: coronal soft tissue · coronal · 0.84mm/px · 3 of 100 slices shown]
[im 34/100  soft-tissue]
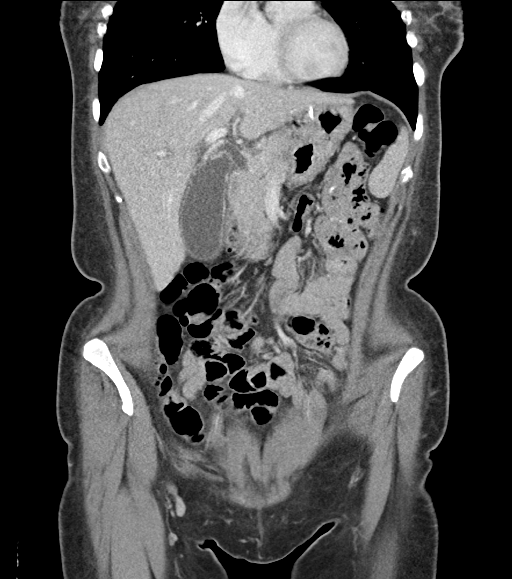
[im 45/100  soft-tissue]
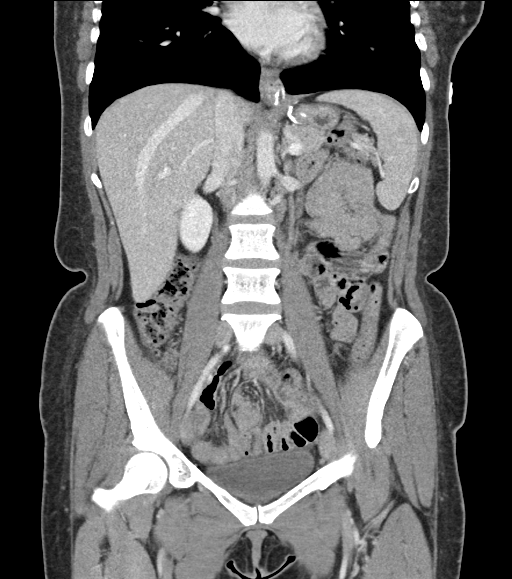
[im 56/100  soft-tissue]
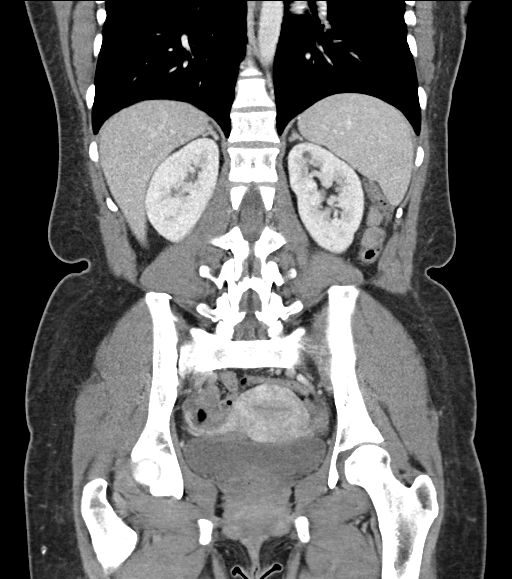

[16 of 46 positions shown; findings below may reference images not displayed]

FINDINGS: Lower chest: Lung bases are clear.

Hepatobiliary: No focal liver lesions are appreciable. Gallbladder
wall does not appear appreciably thickened. There is apparent small
amount of fluid in the gallbladder fossa region. No biliary duct
dilatation evident.

Pancreas: There is no appreciable pancreatic mass or inflammatory
focus.

Spleen: No splenic lesions are evident.

Adrenals/Urinary Tract: Adrenals bilaterally appear normal. There is
a 1 x 1 cm cyst arising from the posterior upper to mid left kidney.
More superiorly, there is an 8 x 5 mm cyst. No hydronephrosis is
evident in either kidney. There is no renal or ureteral calculus on
either side. Urinary bladder is midline with wall thickness within
normal limits.

Stomach/Bowel: There is evidence of previous gastric bypass type
procedure without wall thickening or fluid in this area. Elsewhere
there is no bowel wall or mesenteric thickening. No evident bowel
obstruction. No free air or portal venous air.

Vascular/Lymphatic: There is no abdominal aortic aneurysm. No
vascular lesions are evident. There is no evident adenopathy in the
abdomen or pelvis.

Reproductive: Uterus is anteverted. There is a collapsed cyst in the
right ovary measuring 1.4 x 1.0 cm. There is moderate fluid tracking
from the right adnexa into the cul-de-sac region consistent with
ovarian cyst rupture. No other pelvic mass is evident.

Other: Appendix appears unremarkable. There is no abscess in the
abdomen or pelvis.

Musculoskeletal: No blastic or lytic bone lesions. No intramuscular
or abdominal wall lesions.
IMPRESSION: 1. Evidence of recent right ovarian cyst rupture with fluid tracking
the cul de sac region.

2. The small amount of fluid is noted in the gallbladder fossa
region. The adjacent gallbladder wall does not appear dilated.
Question ascitic fluid from the pelvis tracking into this area
versus possible fluid due to gallbladder inflammation. This finding
may warrant ultrasound of the gallbladder to further assess in this
regard.

3. Evidence of prior gastric bypass procedure without complicating
features. No bowel obstruction. No internal hernia demonstrated on
this study. No abscess evident in the abdomen or pelvis. Appendix
appears normal.

4.  No renal or ureteral calculus.  No hydronephrosis.
